# Patient Record
Sex: Male | Born: 1990 | Race: White | Hispanic: No | Marital: Single | Smoking: Never smoker
Health system: Southern US, Community
[De-identification: ages and names within clinical notes are randomized; demographics above are authoritative.]

## PROBLEM LIST (undated history)

## (undated) DIAGNOSIS — K219 Gastro-esophageal reflux disease without esophagitis: Secondary | ICD-10-CM

## (undated) DIAGNOSIS — J45909 Unspecified asthma, uncomplicated: Secondary | ICD-10-CM

## (undated) HISTORY — DX: Gastro-esophageal reflux disease without esophagitis: K21.9

## (undated) HISTORY — DX: Unspecified asthma, uncomplicated: J45.909

---
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
## 2010-04-05 ENCOUNTER — Ambulatory Visit: Payer: Self-pay | Admitting: Pediatrics

## 2010-08-30 NOTE — Op Note (Signed)
NAMEJURRELL, Nathaniel Ferguson NO.:  [id number]   MEDICAL RECORD NO.:  [id number]          PATIENT TYPE:  OUT   LOCATION:  [facility]                          FACILITY:  [facility]   PHYSICIAN:  Deanna Artis. Hickling, M.D.DATE OF BIRTH:  March 14, 1991   DATE OF PROCEDURE:  03/19/2005  DATE OF DISCHARGE:                                 OPERATIVE REPORT   PROCEDURE:  Lumbar puncture.   CHIEF COMPLAINT:  Persistent headaches since February 10, 2005.   Please see my history and physical note from [facility]  dated March 03, 2005.  In the interim, the patient's headaches have  become less intense.  He has been able to go to school.  However, he has  complained of back pain that extends from T10 down to L1 and tends to walk  slightly hunched over with an antalgic gait.   The patient initially on examination showed give-away strength in his hip  flexors.  This improved on the second attempt.  It was clear that he was  showing evidence of a positive Hoover's sign.   The remainder of the strength in his legs, including knee flexors,  extensors, dorsiflexors and plantar flexors, extensor hallucis longus, were  normal.  Reflexes were brisk.  Sensation was intact with no sensory level.   In this setting, a lumbar puncture was performed.   PROCEDURE:  After informed consent, the patient was sterilely prepped and  draped.  Local anesthesia with 1% Xylocaine was used.  A three-inch 20-gauge  spinal needle was inserted atraumatically at the L3-4 interspace on the  first pass.  Clear colorless fluid 12 mL was obtained and sent to the  laboratory for culture, glucose, protein, cryptococcal antigen, VDRL, cell  count and differential, and Lyme titer by PCR.  The remainder will be saved  pending the results of the spinal fluid.   The patient has been instructed to go home and remain on bed rest for the  better part of the next 24 hours.  He may get up and use the bathroom and  eat.  He may go to school on December 7 if he is not having worsening of his  headaches as he stands.  He should use a heating pad to his back for pain  and may take naproxen 500 mg up to three times daily for back pain over the  next one to two days.  He should call my office at [phone] if headaches  worsen when he stands, and further instructions will be given to his  parents.      Deanna Artis. Sharene Skeans, M.D.  Electronically Signed    [facility]  D:  03/19/2005  T:  03/19/2005  Job:  [id number]   cc:   Georgann Housekeeper, MD  Fax: [phone]

## 2011-04-18 ENCOUNTER — Encounter (INDEPENDENT_AMBULATORY_CARE_PROVIDER_SITE_OTHER): Payer: Self-pay | Admitting: Ophthalmology

## 2011-04-18 ENCOUNTER — Encounter (INDEPENDENT_AMBULATORY_CARE_PROVIDER_SITE_OTHER): Payer: BC Managed Care – PPO | Admitting: Ophthalmology

## 2011-04-18 DIAGNOSIS — Q142 Congenital malformation of optic disc: Secondary | ICD-10-CM

## 2011-04-18 DIAGNOSIS — H43819 Vitreous degeneration, unspecified eye: Secondary | ICD-10-CM

## 2012-04-19 ENCOUNTER — Ambulatory Visit (INDEPENDENT_AMBULATORY_CARE_PROVIDER_SITE_OTHER): Payer: BC Managed Care – PPO | Admitting: Ophthalmology

## 2013-03-28 ENCOUNTER — Ambulatory Visit (INDEPENDENT_AMBULATORY_CARE_PROVIDER_SITE_OTHER): Payer: BC Managed Care – PPO | Admitting: Ophthalmology

## 2013-03-28 DIAGNOSIS — Q142 Congenital malformation of optic disc: Secondary | ICD-10-CM

## 2013-03-28 DIAGNOSIS — H43819 Vitreous degeneration, unspecified eye: Secondary | ICD-10-CM

## 2015-08-28 ENCOUNTER — Encounter: Payer: Self-pay | Admitting: Allergy and Immunology

## 2015-08-28 ENCOUNTER — Ambulatory Visit (INDEPENDENT_AMBULATORY_CARE_PROVIDER_SITE_OTHER): Payer: PRIVATE HEALTH INSURANCE | Admitting: Allergy and Immunology

## 2015-08-28 VITALS — BP 110/72 | HR 80 | Temp 98.1°F | Resp 16 | Ht 69.96 in | Wt 141.6 lb

## 2015-08-28 DIAGNOSIS — J309 Allergic rhinitis, unspecified: Secondary | ICD-10-CM

## 2015-08-28 DIAGNOSIS — J387 Other diseases of larynx: Secondary | ICD-10-CM | POA: Diagnosis not present

## 2015-08-28 DIAGNOSIS — H1010 Acute atopic conjunctivitis, unspecified eye: Secondary | ICD-10-CM

## 2015-08-28 DIAGNOSIS — K219 Gastro-esophageal reflux disease without esophagitis: Secondary | ICD-10-CM

## 2015-08-28 DIAGNOSIS — J4541 Moderate persistent asthma with (acute) exacerbation: Secondary | ICD-10-CM

## 2015-08-28 MED ORDER — RANITIDINE HCL 300 MG PO CAPS: 300.0000 mg | ORAL_CAPSULE | Freq: Every evening | ORAL | Status: AC

## 2015-08-28 MED ORDER — RANITIDINE HCL 300 MG PO CAPS: 300.0000 mg | ORAL_CAPSULE | Freq: Every evening | ORAL | Status: DC

## 2015-08-28 MED ORDER — MONTELUKAST SODIUM 10 MG PO TABS: 10.0000 mg | ORAL_TABLET | Freq: Every day | ORAL | Status: AC

## 2015-08-28 MED ORDER — FLUTICASONE FUROATE-VILANTEROL 200-25 MCG/INH IN AEPB: 1.0000 | INHALATION_SPRAY | Freq: Every day | RESPIRATORY_TRACT | Status: DC

## 2015-08-28 MED ORDER — METHYLPREDNISOLONE ACETATE 80 MG/ML IJ SUSP
80.0000 mg | Freq: Once | INTRAMUSCULAR | Status: AC
  Administered 2015-08-28: 80 mg via INTRAMUSCULAR

## 2015-08-28 MED ORDER — FLUTICASONE FUROATE-VILANTEROL 200-25 MCG/INH IN AEPB: 1.0000 | INHALATION_SPRAY | Freq: Every day | RESPIRATORY_TRACT | Status: AC

## 2015-08-28 MED ORDER — MONTELUKAST SODIUM 10 MG PO TABS: 10.0000 mg | ORAL_TABLET | Freq: Every day | ORAL | Status: DC
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
## 2015-08-28 NOTE — Patient Instructions (Addendum)
  1. Allergen avoidance measures:  2. Treat and prevent inflammation:   A. Breo 200 one inhalation 1 time per day  B. OTC Rhinocort one spray each nostril one time per day  C. montelukast 10 mg one tablet daily  D. Depo-Medrol 80 IM delivered in clinic  3. Treat and prevent reflux:   A. no caffeine or chocolate  B. Zegerid 40 mg one tablet daily in a.m.  C. ranitidine 300 mg one tablet daily in PM  4. If needed:   A. OTC antihistamine - Claritin/Zyrtec/Allegra  B. Proventil HFA 2 puffs every 4-6 hours  5. Return to clinic in 3 weeks or earlier if problem  6. Review chest x-ray obtained yesterday

## 2015-08-29 ENCOUNTER — Ambulatory Visit (HOSPITAL_COMMUNITY)
Admission: RE | Admit: 2015-08-29 | Discharge: 2015-08-29 | Disposition: A | Discharge status: Home / Self Care | Payer: PRIVATE HEALTH INSURANCE | Source: Ambulatory Visit | Attending: Internal Medicine | Admitting: Internal Medicine

## 2015-08-29 ENCOUNTER — Other Ambulatory Visit (HOSPITAL_COMMUNITY): Payer: Self-pay | Admitting: Internal Medicine

## 2015-08-29 ENCOUNTER — Encounter (HOSPITAL_COMMUNITY): Payer: Self-pay

## 2015-08-29 DIAGNOSIS — R918 Other nonspecific abnormal finding of lung field: Secondary | ICD-10-CM | POA: Insufficient documentation

## 2015-08-29 MED ORDER — IOPAMIDOL (ISOVUE-300) INJECTION 61%
75.0000 mL | Freq: Once | INTRAVENOUS | Status: AC | PRN
  Administered 2015-08-29: 75 mL via INTRAVENOUS

## 2015-09-04 ENCOUNTER — Ambulatory Visit (INDEPENDENT_AMBULATORY_CARE_PROVIDER_SITE_OTHER): Payer: PRIVATE HEALTH INSURANCE | Admitting: Allergy and Immunology

## 2015-09-04 ENCOUNTER — Encounter: Payer: Self-pay | Admitting: Allergy and Immunology

## 2015-09-04 ENCOUNTER — Telehealth: Payer: Self-pay | Admitting: Allergy and Immunology

## 2015-09-04 VITALS — BP 104/64 | HR 64 | Resp 16

## 2015-09-04 DIAGNOSIS — J387 Other diseases of larynx: Secondary | ICD-10-CM | POA: Diagnosis not present

## 2015-09-04 DIAGNOSIS — K219 Gastro-esophageal reflux disease without esophagitis: Secondary | ICD-10-CM

## 2015-09-04 DIAGNOSIS — J309 Allergic rhinitis, unspecified: Secondary | ICD-10-CM | POA: Diagnosis not present

## 2015-09-04 DIAGNOSIS — J4540 Moderate persistent asthma, uncomplicated: Secondary | ICD-10-CM | POA: Diagnosis not present

## 2015-09-04 DIAGNOSIS — H1010 Acute atopic conjunctivitis, unspecified eye: Secondary | ICD-10-CM

## 2015-09-04 NOTE — Telephone Encounter (Signed)
MOM ADVISED

## 2015-09-04 NOTE — Progress Notes (Signed)
Follow-up Note  Referring Provider: No ref. provider found Primary Provider: No PCP Per Patient Date of Office Visit: 09/04/2015  Subjective:   Nathaniel Ferguson [facility] (DOB: October 10, 1990) is a 25 y.o. male who [facility]2015 in re-evaluation of the following:  HPI: Nathaniel Ferguson returns to this clinic in reevaluation of his asthma and allergic rhinoconjunctivitis and reflux-induced respiratory disease. While utilizing therapy established 08/29/2015 he is much better already. He has much less issue involving his chest. He does not have any shortness of breath or chest tightness. He has resolved some of his postnasal drip and coughing and his nose is doing much better. He's been very good about using all of his medical therapy.    Medication List           azithromycin 250 MG tablet  Commonly known as:  ZITHROMAX  Take 1 tablet by mouth daily. Reported on 09/04/2015     fluticasone furoate-vilanterol 200-25 MCG/INH Aepb  Commonly known as:  BREO ELLIPTA  Inhale 1 puff into the lungs daily. Rinse, gargle, and spit after use.     montelukast 10 MG tablet  Commonly known as:  SINGULAIR  Take 1 tablet (10 mg total) by mouth daily.     promethazine 50 MG tablet  Commonly known as:  PHENERGAN  Take 1 tablet by mouth every 8 (eight) hours as needed. Reported on 09/04/2015     ranitidine 300 MG capsule  Commonly known as:  ZANTAC  Take 1 capsule (300 mg total) by mouth every evening.     VENTOLIN HFA 108 (90 Base) MCG/ACT inhaler  Generic drug:  albuterol  Inhale 2 puffs into the lungs every 4 (four) hours as needed for wheezing or shortness of breath.     ZEGERID PO  Take 40 mg by mouth daily.        Past Medical History  Diagnosis Date  . Asthma   . GERD (gastroesophageal reflux disease)     History reviewed. No pertinent past surgical history.  No Known Allergies  Review of systems negative except as noted in HPI / PMHx or noted  below:  Review of Systems  Constitutional: Negative.   HENT: Negative.   Eyes: Negative.   Respiratory: Negative.   Cardiovascular: Negative.   Gastrointestinal: Negative.   Genitourinary: Negative.   Musculoskeletal: Negative.   Skin: Negative.   Neurological: Negative.   Endo/Heme/Allergies: Negative.   Psychiatric/Behavioral: Negative.      Objective:   Filed Vitals:   09/04/15 0820  BP: 104/64  Pulse: 64  Resp: 16          Physical Exam  Constitutional: He is well-developed, well-nourished, and in no distress.  HENT:  Head: Normocephalic.  Right Ear: Tympanic membrane, external ear and ear canal normal.  Left Ear: Tympanic membrane, external ear and ear canal normal.  Nose: Nose normal. No mucosal edema or rhinorrhea.  Mouth/Throat: Uvula is midline, oropharynx is clear and moist and mucous membranes are normal. No oropharyngeal exudate.  Eyes: Conjunctivae are normal.  Neck: Trachea normal. No tracheal tenderness present. No tracheal deviation present. No thyromegaly present.  Cardiovascular: Normal rate, regular rhythm, S1 normal, S2 normal and normal heart sounds.   No murmur heard. Pulmonary/Chest: Breath sounds normal. No stridor. No respiratory distress. He has no wheezes. He has no rales.  Musculoskeletal: He exhibits no edema.  Lymphadenopathy:       Head (right side): No tonsillar adenopathy present.  Head (left side): No tonsillar adenopathy present.    He has no cervical adenopathy.  Neurological: He is alert. Gait normal.  Skin: No rash noted. He is not diaphoretic. No erythema. Nails show no clubbing.  Psychiatric: Mood and affect normal.    Diagnostics:    Spirometry was performed and demonstrated an FEV1 of 3.30 at 72 % of predicted.his previous FEV1 was 2.84  The patient had an Asthma Control Test with the following results:  .    Assessment and Plan:   1. Asthma, moderate persistent, well-controlled   2. Allergic  rhinoconjunctivitis   3. LPRD (laryngopharyngeal reflux disease)      1. Allergen avoidance measures:  2. Treat and prevent inflammation:   A. Breo 200 one inhalation 1 time per day  B. OTC Rhinocort one spray each nostril one time per day  C. montelukast 10 mg one tablet daily  3. Treat and prevent reflux:   A. no caffeine or chocolate  B. Zegerid 40 mg one tablet daily in a.m.  C. ranitidine 300 mg one tablet daily in PM  4. If needed:   A. OTC antihistamine - Claritin/Zyrtec/Allegra  B. Proventil HFA 2 puffs every 4-6 hours  5. Return to clinic in 1 year or earlier if problem  [name]'s had an excellent response initially to medical therapy and I encouraged him to consistently use medical therapy specified above for at least for 12 weeks. At the end of 12 weeks he can attempt consolidate his Virgel BouquetBreo and OTC Rhinocort to Monday through Friday use and he can also attempt to discontinue his ranitidine. He is moving back to [place] this Saturday and I've asked him to contact us by email using the my chart system should he have any problems as he moves forward with this plan. If he does well I will see him back in this clinic in 1 year and if he has difficulty with this plan he will need to find someone locally in [place] to address this issue.  Laurette SchimkeEric Arti Trang, MD [facility]

## 2015-09-04 NOTE — Patient Instructions (Signed)
  1. Allergen avoidance measures:  2. Treat and prevent inflammation:   A. Breo 200 one inhalation 1 time per day  B. OTC Rhinocort one spray each nostril one time per day  C. montelukast 10 mg one tablet daily  3. Treat and prevent reflux:   A. no caffeine or chocolate  B. Zegerid 40 mg one tablet daily in a.m.  C. ranitidine 300 mg one tablet daily in PM  4. If needed:   A. OTC antihistamine - Claritin/Zyrtec/Allegra  B. Proventil HFA 2 puffs every 4-6 hours  5. Return to clinic in 1 year or earlier if problem

## 2015-09-04 NOTE — Telephone Encounter (Signed)
Dr .Kozlow, please advise.  

## 2015-09-04 NOTE — Telephone Encounter (Signed)
Please inform patient that a 10% increase in lung function is VERY good. He is now at close to 80% lung function and I suspect that will continue to improve over the next 12 weeks with treatment. He will probably end up between 80 and 100% at 12 weeks.

## 2015-09-04 NOTE — Telephone Encounter (Signed)
She has concerns about the amount of his improvement. She says that Dr. Lucie LeatherKozlow told him at his appointment this morning that he was 10% better from his last visit last week. She feel like that isn't enough. She says he is going out of the country this weekend and she wants to make sure that he is well enough to go.

## 2017-04-28 IMAGING — CT CT CHEST W/ CM
2 of 6 series · 11 of 36 positions shown, 14 images · IV contrast (iopamidol)
Comparison: 12/05/2005

CLINICAL DATA: Dyspnea for 1 year

EXAM:
CT CHEST WITH CONTRAST
TECHNIQUE: Multidetector CT imaging of the chest was performed during
intravenous contrast administration.
CONTRAST:  75mL [id number] IOPAMIDOL ([id number]) INJECTION 61%

[Series 2: chest with st · axial · 0.71mm/px · z∈[-322,-64]mm · 10 of 106 slices shown, 13 images]
[im 10/106  mediastinal]
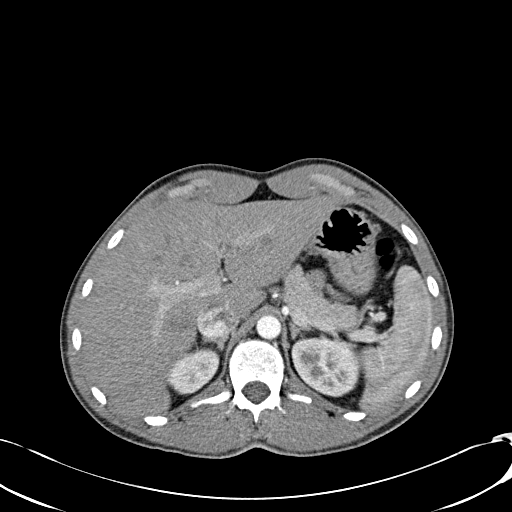
[im 10/106  lung]
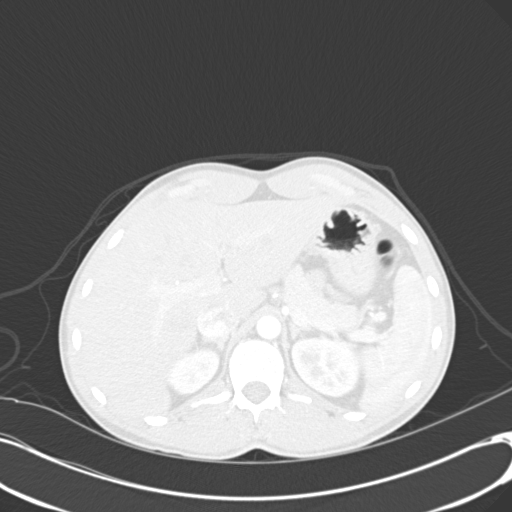
[im 20/106  lung]
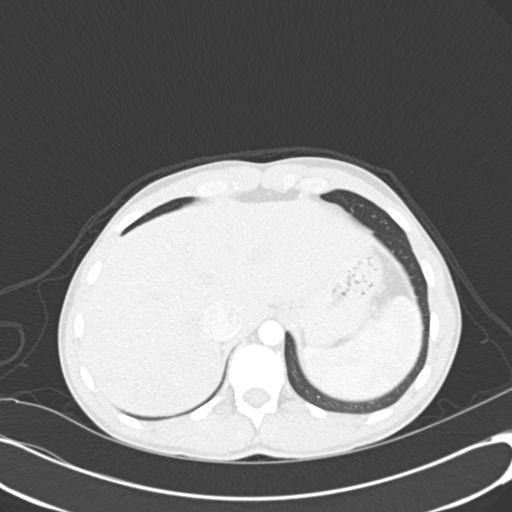
[im 29/106  lung]
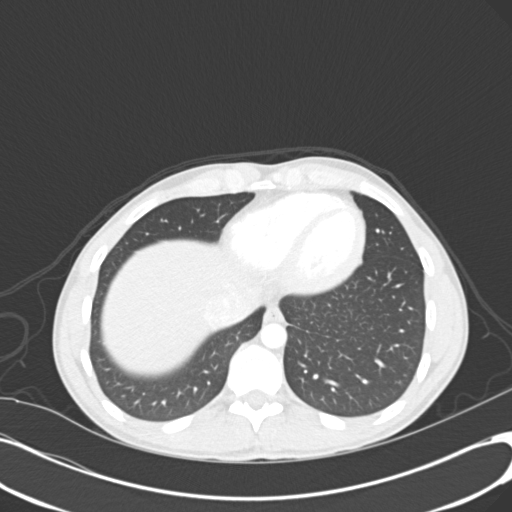
[im 39/106  lung]
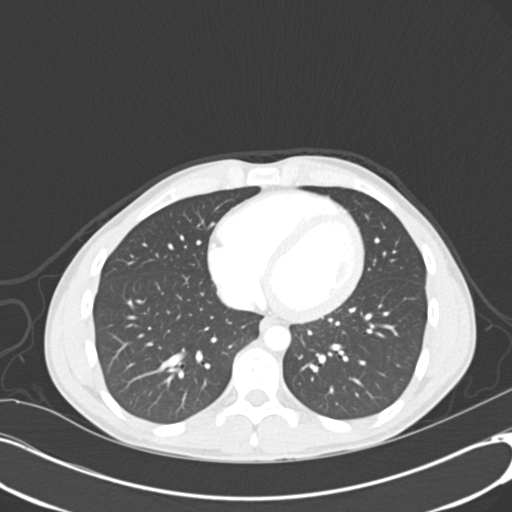
[im 48/106  mediastinal]
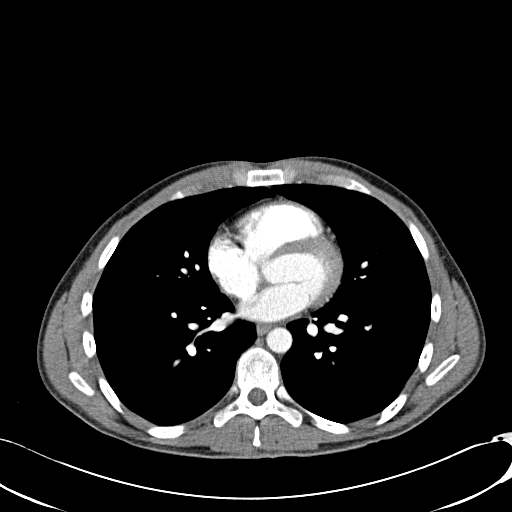
[im 48/106  lung]
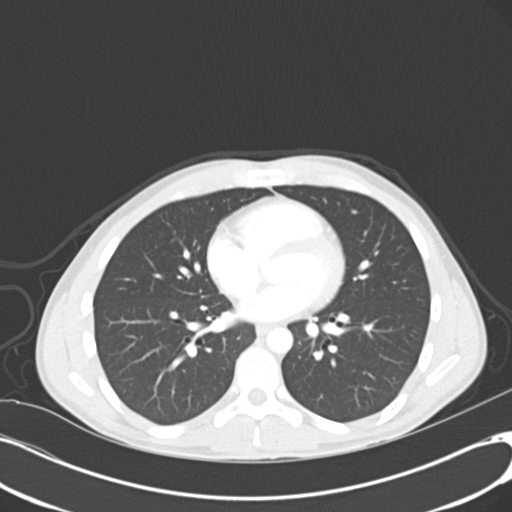
[im 58/106  lung]
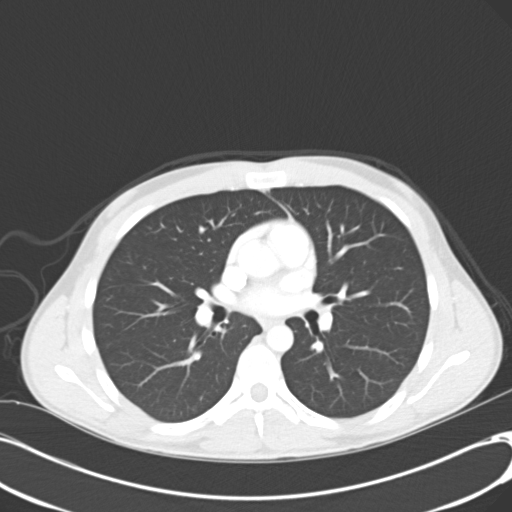
[im 67/106  lung]
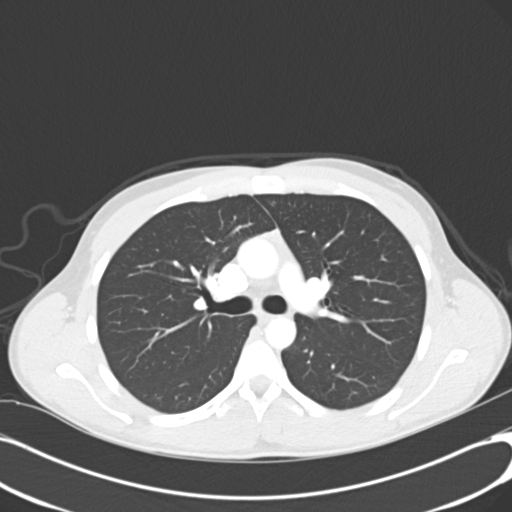
[im 77/106  lung]
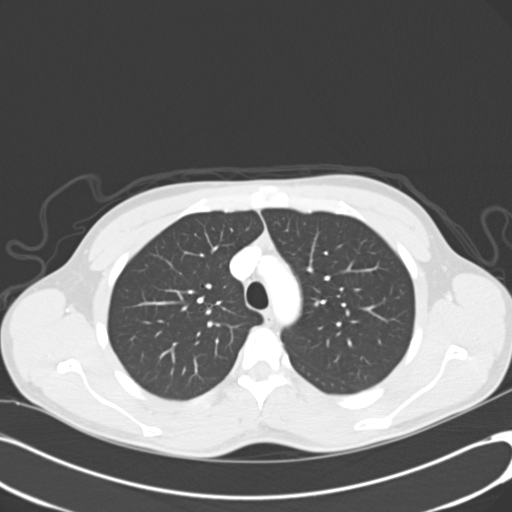
[im 86/106  mediastinal]
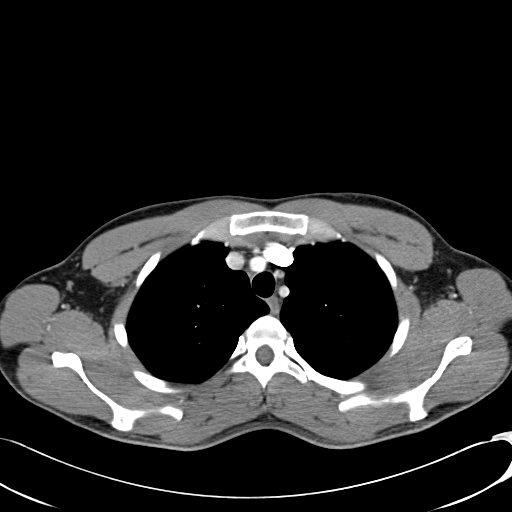
[im 86/106  lung]
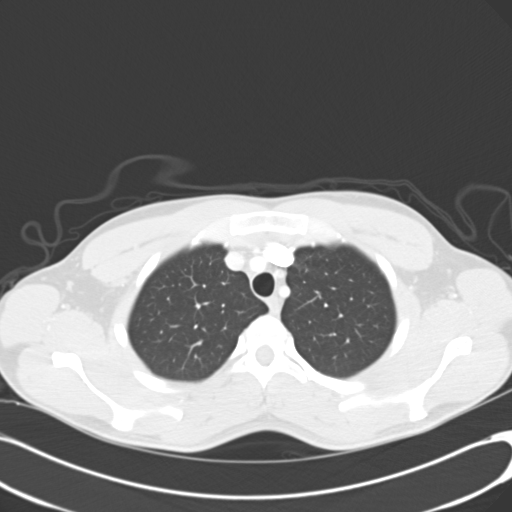
[im 96/106  lung]
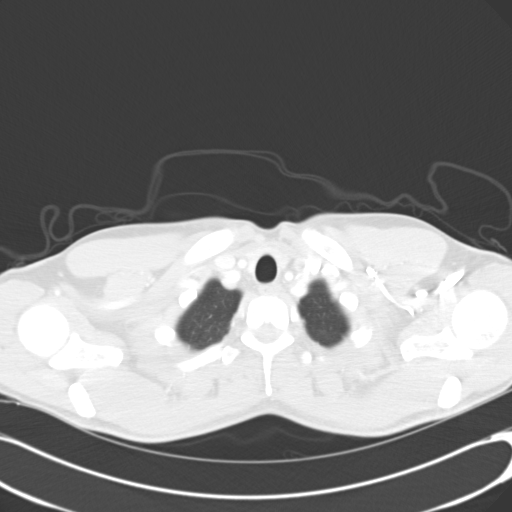

[Series 5: <mpr thick range> · coronal · 0.71mm/px · 1 of 114 slices shown]
[im 57/114  lung]
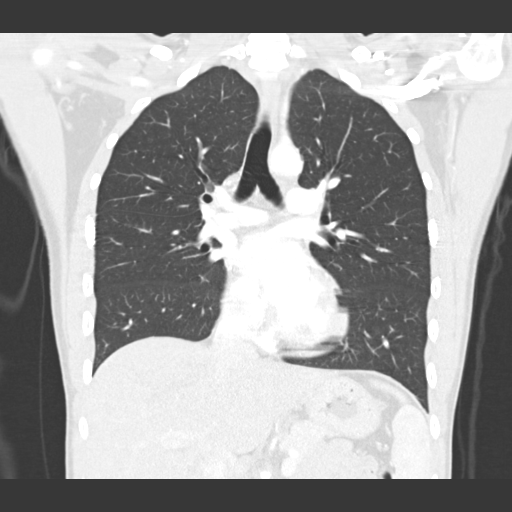

[11 of 36 positions shown; findings below may reference images not displayed]

FINDINGS: Lungs are free of acute infiltrate or sizable effusion. No
pneumothorax is seen. No parenchymal nodules are noted.

The thoracic inlet is within normal limits. The thoracic aorta and
pulmonary artery are well visualized and unremarkable. No hilar or
mediastinal adenopathy is seen. The visualized upper abdomen shows
no acute abnormality. The osseous structures are grossly
unremarkable.
IMPRESSION: No acute abnormality noted.
